# Patient Record
Sex: Female | Born: 1995
Health system: Southern US, Community
[De-identification: ages and names within clinical notes are randomized; demographics above are authoritative.]

## PROBLEM LIST (undated history)

## (undated) DIAGNOSIS — R55 Syncope and collapse: Secondary | ICD-10-CM

## (undated) HISTORY — PX: WISDOM TOOTH EXTRACTION: SHX21

---
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
## 2016-05-10 DIAGNOSIS — B349 Viral infection, unspecified: Secondary | ICD-10-CM | POA: Diagnosis not present

## 2017-03-15 DIAGNOSIS — L810 Postinflammatory hyperpigmentation: Secondary | ICD-10-CM | POA: Diagnosis not present

## 2017-03-15 DIAGNOSIS — L700 Acne vulgaris: Secondary | ICD-10-CM | POA: Diagnosis not present

## 2017-07-14 DIAGNOSIS — R5383 Other fatigue: Secondary | ICD-10-CM | POA: Diagnosis not present

## 2017-07-14 DIAGNOSIS — L709 Acne, unspecified: Secondary | ICD-10-CM | POA: Diagnosis not present

## 2018-02-15 DIAGNOSIS — Z6823 Body mass index (BMI) 23.0-23.9, adult: Secondary | ICD-10-CM | POA: Diagnosis not present

## 2018-02-15 DIAGNOSIS — Z01419 Encounter for gynecological examination (general) (routine) without abnormal findings: Secondary | ICD-10-CM | POA: Diagnosis not present

## 2019-02-02 ENCOUNTER — Other Ambulatory Visit: Payer: Self-pay

## 2019-02-02 DIAGNOSIS — Z20822 Contact with and (suspected) exposure to covid-19: Secondary | ICD-10-CM

## 2019-02-03 LAB — NOVEL CORONAVIRUS, NAA: SARS-CoV-2, NAA: NOT DETECTED

## 2019-03-07 ENCOUNTER — Other Ambulatory Visit: Payer: Self-pay

## 2019-03-07 DIAGNOSIS — Z20822 Contact with and (suspected) exposure to covid-19: Secondary | ICD-10-CM

## 2019-03-09 LAB — NOVEL CORONAVIRUS, NAA: SARS-CoV-2, NAA: NOT DETECTED

## 2019-04-11 ENCOUNTER — Ambulatory Visit: Payer: Self-pay | Attending: Internal Medicine

## 2019-04-11 DIAGNOSIS — Z20822 Contact with and (suspected) exposure to covid-19: Secondary | ICD-10-CM

## 2019-04-11 DIAGNOSIS — Z20828 Contact with and (suspected) exposure to other viral communicable diseases: Secondary | ICD-10-CM | POA: Insufficient documentation

## 2019-04-12 LAB — NOVEL CORONAVIRUS, NAA: SARS-CoV-2, NAA: NOT DETECTED

## 2019-05-22 ENCOUNTER — Ambulatory Visit (HOSPITAL_COMMUNITY)
Admission: RE | Admit: 2019-05-22 | Discharge: 2019-05-22 | Disposition: A | Discharge status: Home / Self Care | Payer: Commercial Managed Care - PPO | Attending: Psychiatry | Admitting: Psychiatry

## 2019-05-22 ENCOUNTER — Encounter (HOSPITAL_COMMUNITY): Payer: Self-pay | Admitting: Psychiatry

## 2019-05-22 DIAGNOSIS — F419 Anxiety disorder, unspecified: Secondary | ICD-10-CM | POA: Insufficient documentation

## 2019-05-22 DIAGNOSIS — F909 Attention-deficit hyperactivity disorder, unspecified type: Secondary | ICD-10-CM | POA: Diagnosis not present

## 2019-05-22 NOTE — BH Assessment (Addendum)
Assessment Note  Sabrina Wise is an 24 y.o. female who presents to [facility] as a walk-in seeking help for her anxiety issues and panic attacks.  Patient states that she is overwhelmed due to job stressors, having broken up with her ex-boyfriend who was an addict and being in a new relationship.  She states that he also has a history of bulimia.  Patient states that she has been experiencing panic attacks lately and feels like she needs to get some help for them.  Patient states that at times that she wishes that she was dead, but states that she has never been suicidal or attempted suicide.  Patient has no real history of mental illness treatment in the past.  She denies HI/Psychosis and states that she only has a few drinks on weekends. Patient denies any history of drug use.   Patient denies any history of abuse or self-mutilation.  Patient states that her bulimia has been under control for the past year and she states that she has a good appetite and sleeps on average of 5 hours per night most often.  Patient states that she does not need to be in the hospital and can contract for safety, but does states that she needs to get into counseling ASAP.  Patient states that she would like to avoid medications to treat her anxiety.  Patient presented as alert and oriented.  Her mood was anxious and her affect mildly flat at times.  Her thoughts were organized and her memory intact.  Her insight was mildly impaired, but her judgment and impulse control appeared to be good.  Patient did not appear to be responding to any internal stimuli.  Her eye contact was good and her speech coherent.    Diagnosis: F41 Generalized Anxiety Disorder  Past Medical History:  Past Medical History:  Diagnosis Date  . Syncope     Past Surgical History:  Procedure Laterality Date  . WISDOM TOOTH EXTRACTION      Family History: No family history on file.  Social History:  reports that she has never smoked. She has never  used smokeless tobacco. She reports that she does not drink alcohol or use drugs.  Additional Social History:  Alcohol / Drug Use Pain Medications: denies Prescriptions: denies Over the Counter: denies History of alcohol / drug use?: No history of alcohol / drug abuse Longest period of sobriety (when/how long): N/A  CIWA: CIWA-Ar BP: 106/72 Pulse Rate: (!) 121 COWS:    Allergies: No Known Allergies  Home Medications: (Not in a hospital admission)   OB/GYN Status:  No LMP recorded.  General Assessment Data Location of Assessment: [facility] TTS Assessment: In system Is this a Tele or Face-to-Face Assessment?: Face-to-Face Is this an Initial Assessment or a Re-assessment for this encounter?: Initial Assessment Patient Accompanied by:: N/A Language Other than English: No Living Arrangements: Other (Comment)(lives with roommate) What gender do you identify as?: Female Marital status: Single Maiden name: Veronica Pregnancy Status: No Living Arrangements: Other (Comment)(with roommates) Can pt return to current living arrangement?: Yes Admission Status: Voluntary Is patient capable of signing voluntary admission?: Yes Referral Source: Self/Family/Friend Insurance type: UMR  Medical Screening Exam (Philip) Medical Exam completed: Yes  Crisis Care Plan Living Arrangements: Other (Comment)(with roommates) Legal Guardian: Other:(self) Name of Psychiatrist: none Name of Therapist: none  Education Status Is patient currently in school?: No Is the patient employed, unemployed or receiving disability?: Unemployed  Risk to self with the past  6 months Suicidal Ideation: No Has patient been a risk to self within the past 6 months prior to admission? : No Suicidal Intent: No Has patient had any suicidal intent within the past 6 months prior to admission? : No Is patient at risk for suicide?: No Suicidal Plan?: No Has patient had any suicidal plan within  the past 6 months prior to admission? : No Access to Means: No What has been your use of drugs/alcohol within the last 12 months?: weekends Previous Attempts/Gestures: No How many times?: 0 Other Self Harm Risks: none Triggers for Past Attempts: None known Intentional Self Injurious Behavior: None Family Suicide History: No Recent stressful life event(s): Other (Comment)(recent relationship break-up and job stress) Persecutory voices/beliefs?: No Depression: No Substance abuse history and/or treatment for substance abuse?: No Suicide prevention information given to non-admitted patients: Not applicable  Risk to Others within the past 6 months Homicidal Ideation: No Does patient have any lifetime risk of violence toward others beyond the six months prior to admission? : No Thoughts of Harm to Others: No Current Homicidal Intent: No Current Homicidal Plan: No Access to Homicidal Means: No Identified Victim: none History of harm to others?: No Assessment of Violence: None Noted Violent Behavior Description: none Does patient have access to weapons?: No Criminal Charges Pending?: No Does patient have a court date: No Is patient on probation?: No  Psychosis Hallucinations: None noted Delusions: None noted  Mental Status Report Appearance/Hygiene: Unremarkable Eye Contact: Good Motor Activity: Freedom of movement Speech: Unremarkable Level of Consciousness: Alert Mood: Anxious Affect: Anxious Anxiety Level: Severe Thought Processes: Coherent, Relevant Judgement: Unimpaired Orientation: Person, Place, Time, Situation Obsessive Compulsive Thoughts/Behaviors: None  Cognitive Functioning Concentration: Normal Memory: Recent Intact, Remote Intact Is patient IDD: No Insight: Fair Impulse Control: Good Appetite: Good Have you had any weight changes? : No Change Sleep: No Change Total Hours of Sleep: 8 Vegetative Symptoms: None  ADLScreening [facility] Assessment  Services) Patient's cognitive ability adequate to safely complete daily activities?: Yes Patient able to express need for assistance with ADLs?: Yes Independently performs ADLs?: Yes (appropriate for developmental age)  Prior Inpatient Therapy Prior Inpatient Therapy: No  Prior Outpatient Therapy Prior Outpatient Therapy: No Does patient have an ACCT team?: No Does patient have Intensive In-House Services?  : No Does patient have [facility] services? : No Does patient have P4CC services?: No  ADL Screening (condition at time of admission) Patient's cognitive ability adequate to safely complete daily activities?: Yes Is the patient deaf or have difficulty hearing?: No Does the patient have difficulty seeing, even when wearing glasses/contacts?: No Does the patient have difficulty concentrating, remembering, or making decisions?: No Patient able to express need for assistance with ADLs?: Yes Does the patient have difficulty dressing or bathing?: No Independently performs ADLs?: Yes (appropriate for developmental age) Does the patient have difficulty walking or climbing stairs?: No Weakness of Legs: None Weakness of Arms/Hands: None  Home Assistive Devices/Equipment Home Assistive Devices/Equipment: None  Therapy Consults (therapy consults require a physician order) PT Evaluation Needed: No OT Evalulation Needed: No SLP Evaluation Needed: No Abuse/Neglect Assessment (Assessment to be complete while patient is alone) Abuse/Neglect Assessment Can Be Completed: Yes Physical Abuse: Denies Verbal Abuse: Denies Sexual Abuse: Denies Exploitation of patient/patient's resources: Denies Self-Neglect: Denies Values / Beliefs Cultural Requests During Hospitalization: None Spiritual Requests During Hospitalization: None Consults Spiritual Care Consult Needed: No Transition of Care Team Consult Needed: No Advance Directives (For Healthcare) Does Patient Have a Medical Advance Directive?:  No Would patient like information on creating a medical advance directive?: No - Patient declined Nutrition Screen- [facility] Adult/[facility]/AP Has the patient recently lost weight without trying?: No Has the patient been eating poorly because of a decreased appetite?: No Malnutrition Screening Tool Score: 0        Disposition: Per Denzil Magnuson, NP, patient does not meet inpatient treatment criteria and can follow-up with an outpatient provider Disposition Initial Assessment Completed for this Encounter: Yes Disposition of Patient: Discharge Patient refused recommended treatment: No Mode of transportation if patient is discharged/movement?: Car Patient referred to: Outpatient clinic referral  On Site Evaluation by:   Reviewed with Physician:    Arnoldo Lenis Dominion Kathan 05/22/2019 5:27 PM
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
